# Patient Record
Sex: Male | Born: 1950 | Race: Black or African American | Hispanic: No | Smoking: Never smoker
Health system: Southern US, Community
[De-identification: ages and names within clinical notes are randomized; demographics above are authoritative.]

## PROBLEM LIST (undated history)

## (undated) DIAGNOSIS — K648 Other hemorrhoids: Secondary | ICD-10-CM

## (undated) DIAGNOSIS — H409 Unspecified glaucoma: Secondary | ICD-10-CM

## (undated) DIAGNOSIS — K219 Gastro-esophageal reflux disease without esophagitis: Secondary | ICD-10-CM

## (undated) DIAGNOSIS — R197 Diarrhea, unspecified: Secondary | ICD-10-CM

## (undated) DIAGNOSIS — R1310 Dysphagia, unspecified: Secondary | ICD-10-CM

## (undated) DIAGNOSIS — H269 Unspecified cataract: Secondary | ICD-10-CM

## (undated) DIAGNOSIS — G4730 Sleep apnea, unspecified: Secondary | ICD-10-CM

## (undated) HISTORY — DX: Sleep apnea, unspecified: G47.30

## (undated) HISTORY — DX: Gastro-esophageal reflux disease without esophagitis: K21.9

## (undated) HISTORY — DX: Unspecified glaucoma: H40.9

## (undated) HISTORY — PX: CATARACT EXTRACTION: SUR2

## (undated) HISTORY — PX: SKIN GRAFT: SHX250

## (undated) HISTORY — DX: Dysphagia, unspecified: R13.10

## (undated) HISTORY — DX: Diarrhea, unspecified: R19.7

## (undated) HISTORY — DX: Unspecified cataract: H26.9

## (undated) HISTORY — DX: Other hemorrhoids: K64.8

---

## 2008-05-01 HISTORY — PX: COLONOSCOPY: SHX174

## 2008-05-01 HISTORY — PX: ESOPHAGOGASTRODUODENOSCOPY: SHX1529

## 2016-05-01 HISTORY — PX: GLAUCOMA SURGERY: SHX656

## 2017-09-06 ENCOUNTER — Ambulatory Visit
Admission: RE | Admit: 2017-09-06 | Discharge: 2017-09-06 | Disposition: A | Discharge status: Home / Self Care | Payer: Medicare Other | Source: Ambulatory Visit | Attending: Family Medicine | Admitting: Family Medicine

## 2017-09-06 ENCOUNTER — Other Ambulatory Visit: Payer: Self-pay | Admitting: Family Medicine

## 2017-09-06 DIAGNOSIS — J342 Deviated nasal septum: Secondary | ICD-10-CM

## 2017-09-06 DIAGNOSIS — R05 Cough: Secondary | ICD-10-CM

## 2017-09-06 DIAGNOSIS — G4733 Obstructive sleep apnea (adult) (pediatric): Secondary | ICD-10-CM

## 2017-09-06 DIAGNOSIS — R059 Cough, unspecified: Secondary | ICD-10-CM

## 2017-09-06 DIAGNOSIS — J45909 Unspecified asthma, uncomplicated: Secondary | ICD-10-CM

## 2017-09-06 DIAGNOSIS — K219 Gastro-esophageal reflux disease without esophagitis: Secondary | ICD-10-CM

## 2017-09-06 DIAGNOSIS — Z634 Disappearance and death of family member: Secondary | ICD-10-CM

## 2017-09-06 DIAGNOSIS — F329 Major depressive disorder, single episode, unspecified: Secondary | ICD-10-CM

## 2017-10-09 ENCOUNTER — Encounter (HOSPITAL_COMMUNITY): Payer: Self-pay | Admitting: Emergency Medicine

## 2017-10-09 ENCOUNTER — Emergency Department (HOSPITAL_COMMUNITY)
Admission: EM | Admit: 2017-10-09 | Discharge: 2017-10-09 | Disposition: A | Discharge status: Home / Self Care | Payer: Medicare Other | Attending: Emergency Medicine | Admitting: Emergency Medicine

## 2017-10-09 ENCOUNTER — Other Ambulatory Visit: Payer: Self-pay

## 2017-10-09 DIAGNOSIS — K649 Unspecified hemorrhoids: Secondary | ICD-10-CM | POA: Insufficient documentation

## 2017-10-09 DIAGNOSIS — Z5321 Procedure and treatment not carried out due to patient leaving prior to being seen by health care provider: Secondary | ICD-10-CM | POA: Insufficient documentation

## 2017-10-09 NOTE — ED Notes (Signed)
Patient requested that we not re assess vital signs. Patient states he just wants to go back to a room.

## 2017-10-09 NOTE — ED Triage Notes (Signed)
Pt reports bleeding hemorrhoids today. Pt reports bleeding is more than normal.

## 2017-10-09 NOTE — ED Notes (Signed)
Pt states that he can not wait any longer. Apologized for delay.

## 2017-10-09 NOTE — ED Notes (Signed)
Pt refused vitals 

## 2018-12-03 ENCOUNTER — Other Ambulatory Visit: Payer: Self-pay

## 2018-12-03 DIAGNOSIS — Z20822 Contact with and (suspected) exposure to covid-19: Secondary | ICD-10-CM

## 2018-12-04 LAB — NOVEL CORONAVIRUS, NAA: SARS-CoV-2, NAA: NOT DETECTED

## 2019-01-17 ENCOUNTER — Other Ambulatory Visit: Payer: Self-pay | Admitting: *Deleted

## 2019-01-17 DIAGNOSIS — Z20822 Contact with and (suspected) exposure to covid-19: Secondary | ICD-10-CM

## 2019-01-18 LAB — NOVEL CORONAVIRUS, NAA: SARS-CoV-2, NAA: NOT DETECTED

## 2019-03-17 ENCOUNTER — Other Ambulatory Visit: Payer: Self-pay

## 2019-03-17 ENCOUNTER — Encounter: Payer: Self-pay | Admitting: Internal Medicine

## 2019-03-17 ENCOUNTER — Ambulatory Visit (INDEPENDENT_AMBULATORY_CARE_PROVIDER_SITE_OTHER): Payer: Medicare Other | Admitting: Internal Medicine

## 2019-03-17 VITALS — BP 110/76 | HR 61 | Ht 72.0 in | Wt 209.0 lb

## 2019-03-17 DIAGNOSIS — G4733 Obstructive sleep apnea (adult) (pediatric): Secondary | ICD-10-CM

## 2019-03-17 DIAGNOSIS — K219 Gastro-esophageal reflux disease without esophagitis: Secondary | ICD-10-CM | POA: Diagnosis not present

## 2019-03-17 NOTE — Assessment & Plan Note (Signed)
Has lost 30 lbs since original dx OSA and trial of CPAP 10 years ago, but told he snores loudly. Few teeth, so not candidate for oral appliance.  Plan- sleep study, then I agreed to refer first to ENT for eval of nasopharyngeal discomfort he relates to old nasal fax. He also asks about Inspire. Then, if he chooses CPAP trial again, we can help with that if appropriate.

## 2019-03-17 NOTE — Progress Notes (Signed)
03/17/2019- 68 yoM never smoker for sleep evaluation.  Medical problem list includes GERD, Insomnia. He had sleep studies in [place]/ [place] several years ago and was prescribed CPAP there. Tried different masks but couldn't get comfortable. At that time he was trying to care for terminally ill wife. After she passed, he moved here, has lost 30 lbs with diet and exercise. Current partner tells him he snores loudly.  He broke his nose several years ago, breathes poorly through nose lying down, and tends to wake in AM with sore R side of throat. Breathe Right strips no help.  Can't sleep on back due to low back pain. Occasional sinus headache. Not aware of heart or lung disease. Has had multiple dental extractions. He asks about Inspire surgery and ENT attention to nasopharynx. Epworth score 7  Had flu vax Body weight today 209 lbs  Prior to Admission medications   Medication Sig Start Date End Date Taking? Authorizing Provider  HYDROcodone-acetaminophen (NORCO) 10-325 MG tablet Take 1 tablet by mouth 3 (three) times daily as needed. 02/19/19  Yes [provider]  lidocaine (LIDODERM) 5 % APPLY 1 PATCH TO SKIN DAILY AS DIRECTED *LEAVE ON UP TO 12 HOURS, THEN REMOVE FOR 12 HOURS* 12/30/18  Yes [provider]  omeprazole (PRILOSEC) 40 MG capsule TAKE 1 CAPSULE BY MOUTH ONCE DAILY AS NEEDED 12/14/18  Yes [provider]  tadalafil (CIALIS) 5 MG tablet SMARTSIG:1 Tablet(s) By Mouth As Needed 02/18/19  Yes [provider]   History reviewed. No pertinent past medical history. Past Surgical History:  Procedure Laterality Date  . SKIN GRAFT     History reviewed. No pertinent family history. Social History   Socioeconomic History  . Marital status: Widowed    Spouse name: Not on file  . Number of children: Not on file  . Years of education: Not on file  . Highest education level: Not on file  Occupational History  . Not on file  Social Needs  .  Financial resource strain: Not on file  . Food insecurity    Worry: Not on file    Inability: Not on file  . Transportation needs    Medical: Not on file    Non-medical: Not on file  Tobacco Use  . Smoking status: Never Smoker  . Smokeless tobacco: Never Used  Substance and Sexual Activity  . Alcohol use: Never    Frequency: Never  . Drug use: Never  . Sexual activity: Not on file  Lifestyle  . Physical activity    Days per week: Not on file    Minutes per session: Not on file  . Stress: Not on file  Relationships  . Social Herbalist on phone: Not on file    Gets together: Not on file    Attends religious service: Not on file    Active member of club or organization: Not on file    Attends meetings of clubs or organizations: Not on file    Relationship status: Not on file  . Intimate partner violence    Fear of current or ex partner: Not on file    Emotionally abused: Not on file    Physically abused: Not on file    Forced sexual activity: Not on file  Other Topics Concern  . Not on file  Social History Narrative  . Not on file   ROS-see HPI   + = positive Constitutional:    +weight loss, night sweats,  fevers, chills, fatigue, lassitude. HEENT:    +headaches, difficulty swallowing, tooth/dental problems, sore throat,       sneezing, itching, ear ache, nasal congestion, post nasal drip, snoring CV:    chest pain, orthopnea, PND, swelling in lower extremities, anasarca,                                  dizziness, palpitations Resp:   shortness of breath with exertion or at rest.                productive cough,   non-productive cough, coughing up of blood.              change in color of mucus.  wheezing.   Skin:    rash or lesions. GI:  No-   heartburn, indigestion, abdominal pain, nausea, vomiting, diarrhea,                 change in bowel habits, loss of appetite GU: dysuria, change in color of urine, no urgency or frequency.   flank pain. MS:   joint  pain, stiffness, decreased range of motion, back pain. Neuro-     nothing unusual Psych:  change in mood or affect.  depression or anxiety.   memory loss.  OBJ- Physical Exam General- Alert, Oriented, Affect-appropriate, Distress- none acute Skin- rash-none, lesions- none, excoriation- none Lymphadenopathy- none Head- atraumatic            Eyes- Gross vision intact, PERRLA, conjunctivae and secretions clear            Ears- Hearing, canals-normal            Nose- Clear, No-mucus, polyps, erosion, perforation             Throat- Mallampati III-IV , mucosa clear , drainage- none, tonsils- atrophic Neck- flexible , trachea midline, no stridor , thyroid nl, carotid no bruit Chest - symmetrical excursion , unlabored           Heart/CV- RRR , no murmur , no gallop  , no rub, nl s1 s2                           - JVD- none , edema- none, stasis changes- none, varices- none           Lung- clear to P&A, wheeze- none, cough- none , dullness-none, rub- none           Chest wall-  Abd-  Br/ Gen/ Rectal- Not done, not indicated Extrem- cyanosis- none, clubbing, none, atrophy- none, strength- nl Neuro- grossly intact to observation

## 2019-03-17 NOTE — Patient Instructions (Addendum)
Order- schedule home sleep test    Dx OSA  Please call us about 2 weeks after your sleep test is done for results and recommendations. I am anticipating at that point we may want to refer you to ENT to evaluate your upper airway, and possibly to discuss the [facility] surgical procedure for sleep apnea.

## 2019-03-17 NOTE — Assessment & Plan Note (Signed)
Managed by PCP

## 2019-03-24 ENCOUNTER — Other Ambulatory Visit: Payer: Self-pay

## 2019-03-24 DIAGNOSIS — Z20822 Contact with and (suspected) exposure to covid-19: Secondary | ICD-10-CM

## 2019-03-25 LAB — NOVEL CORONAVIRUS, NAA: SARS-CoV-2, NAA: NOT DETECTED

## 2019-05-07 ENCOUNTER — Ambulatory Visit: Payer: Medicare Other | Attending: Internal Medicine

## 2019-05-07 DIAGNOSIS — Z20822 Contact with and (suspected) exposure to covid-19: Secondary | ICD-10-CM

## 2019-05-08 LAB — NOVEL CORONAVIRUS, NAA: SARS-CoV-2, NAA: NOT DETECTED

## 2019-05-14 ENCOUNTER — Telehealth: Payer: Self-pay | Admitting: Internal Medicine

## 2019-05-14 NOTE — Telephone Encounter (Signed)
Scheduled for 1/15 @ 9 AM.  Nothing further needed at this time

## 2019-05-16 ENCOUNTER — Ambulatory Visit: Payer: Medicare Other

## 2019-05-16 ENCOUNTER — Other Ambulatory Visit: Payer: Self-pay

## 2019-05-16 DIAGNOSIS — G4733 Obstructive sleep apnea (adult) (pediatric): Secondary | ICD-10-CM

## 2019-06-03 ENCOUNTER — Other Ambulatory Visit: Payer: Self-pay

## 2019-06-03 ENCOUNTER — Ambulatory Visit: Payer: Medicare Other

## 2019-06-03 DIAGNOSIS — G4733 Obstructive sleep apnea (adult) (pediatric): Secondary | ICD-10-CM | POA: Diagnosis not present

## 2019-06-06 DIAGNOSIS — G4733 Obstructive sleep apnea (adult) (pediatric): Secondary | ICD-10-CM

## 2019-06-10 ENCOUNTER — Encounter: Payer: Self-pay | Admitting: Gastroenterology

## 2019-06-13 ENCOUNTER — Telehealth: Payer: Self-pay | Admitting: Internal Medicine

## 2019-06-13 NOTE — Telephone Encounter (Signed)
Called the patient and made him aware we had not called. However, he may have received an automated message reminding him of his appointment with Dr. Annamaria Boots on 06/17/19.  Patient voiced understanding. Nothing further needed at this time.

## 2019-06-17 ENCOUNTER — Ambulatory Visit (INDEPENDENT_AMBULATORY_CARE_PROVIDER_SITE_OTHER): Payer: Medicare Other | Admitting: Internal Medicine

## 2019-06-17 ENCOUNTER — Other Ambulatory Visit: Payer: Self-pay

## 2019-06-17 ENCOUNTER — Encounter: Payer: Self-pay | Admitting: Internal Medicine

## 2019-06-17 VITALS — BP 118/68 | HR 80 | Temp 97.3°F | Ht 72.0 in | Wt 208.0 lb

## 2019-06-17 DIAGNOSIS — G4733 Obstructive sleep apnea (adult) (pediatric): Secondary | ICD-10-CM | POA: Diagnosis not present

## 2019-06-17 DIAGNOSIS — J3489 Other specified disorders of nose and nasal sinuses: Secondary | ICD-10-CM | POA: Diagnosis not present

## 2019-06-17 NOTE — Assessment & Plan Note (Signed)
Didn't tolerate CPAP airflow 10+ years ago because airflow was uncomfortable in his nose. He suggests we retry CPAP. We will also refer to ENT to see if nasal airway can be improved, and to discuss options.  Plan- CPAP auto 5-20. Refer to ENT

## 2019-06-17 NOTE — Progress Notes (Signed)
HPI M never smoker followed for OSA, complicated by GERD, Insomnia, hx nasal fx, low back pain HST 06/03/19- AHI 33.1/ hr, desaturation to 79%, Body weight 209 l  ------------------------------------------------------------------------------- 03/17/2019- 68 yoM never smoker for sleep evaluation.  Medical problem list includes GERD, Insomnia. He had sleep studies in [place] several years ago and was prescribed CPAP there. Tried different masks but couldn't get comfortable. At that time he was trying to care for terminally ill wife. After she passed, he moved here, has lost 30 lbs with diet and exercise. Current partner tells him he snores loudly.  He broke his nose several years ago, breathes poorly through nose lying down, and tends to wake in AM with sore R side of throat. Breathe Right strips no help.  Can't sleep on back due to low back pain. Occasional sinus headache. Not aware of heart or lung disease. Has had multiple dental extractions. He asks about Inspire surgery and ENT attention to nasopharynx. 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 Deviation with chronically blocked R nostril made airflow from CPAP uncomfortable 10 years ago. We reviewed options. He would like to restart CPAP with newer technology, while also referring to ENT to discuss options.  ROS-see HPI   + = positive Constitutional:    +weight loss, night sweats, fevers, chills, fatigue, lassitude. HEENT:    +headaches, difficulty swallowing, tooth/dental problems, sore throat,       sneezing, itching, ear ache, nasal congestion, post nasal drip, snoring CV:    chest pain, orthopnea, PND, swelling in lower extremities, anasarca,                                   dizziness, palpitations Resp:   shortness of breath with exertion or at rest.                productive cough,   non-productive cough, coughing up of blood.              change in color of mucus.  wheezing.   Skin:    rash or lesions. GI:  No-   heartburn, indigestion, abdominal pain, nausea, vomiting, diarrhea,                 change in bowel habits, loss of appetite GU: dysuria, change in color of urine, no urgency or frequency.   flank pain. MS:   joint pain, stiffness, decreased range of motion, back pain. Neuro-     nothing unusual Psych:  change in mood or affect.  depression or anxiety.   memory loss.  OBJ- Physical Exam General- Alert, Oriented, Affect-appropriate, Distress- none acute Skin- rash-none, lesions- none, excoriation- none Lymphadenopathy- none Head- atraumatic            Eyes- Gross vision intact, PERRLA, conjunctivae and secretions clear            Ears- Hearing, canals-normal            Nose- +Old trauma, narrow on R            Throat- Mallampati III-IV , mucosa clear , drainage- none, tonsils-  atrophic Neck- flexible , trachea midline, no stridor , thyroid nl, carotid no bruit Chest - symmetrical excursion , unlabored           Heart/CV- RRR , no murmur , no gallop  , no rub, nl s1 s2                           - JVD- none , edema- none, stasis changes- none, varices- none           Lung- clear to P&A, wheeze- none, cough- none , dullness-none, rub- none           Chest wall-  Abd-  Br/ Gen/ Rectal- Not done, not indicated Extrem- cyanosis- none, clubbing, none, atrophy- none, strength- nl Neuro- grossly intact to observation

## 2019-06-17 NOTE — Patient Instructions (Signed)
Order- new DME, new CPAP auto 5-20, mask of choice, humidifier, supplies, AirView/ card   Order- Refer to [facility] ENT- Dr Redmond Baseman' group    Dx Nasal obstruction, OSA  Please call if we can help

## 2019-06-24 ENCOUNTER — Telehealth: Payer: Self-pay | Admitting: Internal Medicine

## 2019-06-24 NOTE — Telephone Encounter (Signed)
Called that patient back and advised him the DME was [facility]. Advised if he had not heard anything from them by 06/27/19 to call them at [phone] the following Monday to get a status on his order.  The patient stated he has already been contacted by ENT and has an appointment.  Nothing further needed at this time.

## 2019-06-27 ENCOUNTER — Encounter: Payer: Medicare Other | Admitting: Gastroenterology

## 2019-07-14 ENCOUNTER — Encounter: Payer: Self-pay | Admitting: Internal Medicine

## 2019-07-15 ENCOUNTER — Telehealth: Payer: Self-pay | Admitting: Internal Medicine

## 2019-07-15 DIAGNOSIS — G4733 Obstructive sleep apnea (adult) (pediatric): Secondary | ICD-10-CM

## 2019-07-15 NOTE — Telephone Encounter (Signed)
Called and spoke with pt who stated he feels like the pressure has been too much for him. Pt stated last night he was wearing the cpap but took awhile for him to go to sleep. Pt woke up with dry mouth, drank some water which helped some with that. Pt stated due to the pressure he ended up having intense pain in rib around sternum which he believes is from the pressure.  Pt wants to know if the pressure could be adjusted or if he should fully stop wearing the cpap. Dr. Annamaria Boots, please advise on this for pt. Download has been printed for review.

## 2019-07-15 NOTE — Telephone Encounter (Signed)
Order- DME [facility]- please change autopap range to 5-15  Please remind patient to continue using Flonase in nose every night as instructed by Dr Redmond Baseman ENT, to try to improve his nasal airway.

## 2019-07-15 NOTE — Telephone Encounter (Signed)
Called and spoke with Ronald Nash letting him know the info stated by Dr. Annamaria Boots and he verbalized understanding. Order has been placed for Ronald Nash's settings to be changed. Nothing further needed.

## 2019-08-11 ENCOUNTER — Encounter: Payer: Self-pay | Admitting: Internal Medicine

## 2019-08-13 ENCOUNTER — Telehealth: Payer: Self-pay

## 2019-08-13 NOTE — Telephone Encounter (Signed)
Tried to contact patient regarding a fax we got from [place] to see if we are able to get patient into the office at next available per Dr.Young patient can make a office  Visit with 1 of the NP'S.

## 2019-08-18 NOTE — Progress Notes (Signed)
@Patient  ID: Ronald Nash, male    DOB: 07-15-1950, 69 y.o.   MRN: [id number]  Chief Complaint  Patient presents with  . Follow-up    F/U for OSA on cpap. Increased throat dryness and neck muscle spasms since using cpap machine. Has stopped using cpap machine due to this.     Referring provider: Antonietta Jewel, MD  HPI:  69 year old male never smoker followed in our office for severe obstructive sleep apnea  PMH: GERD, insomnia Smoker/ Smoking History: Never smoker Maintenance:  None  Pt of: Dr. Annamaria Boots  08/19/2019  - Visit   69 year old male never smoker followed in our office for severe obstructive sleep apnea.  Patient was started on CPAP therapy.  He reports that he had noticed increased throat dryness and muscle spasms so he has briefly stopped using his CPAP machine.  He would like to discuss this today.  Patient's AHI was 33.1 in February/2021.  Patient CPAP compliance shows adequate compliance.  See that information listed below:  07/13/2019-08/11/2019-28 in the last 30 days used, 16 of those days greater than 4 hours, average usage 4 hours and 46 minutes, APAP setting 5-15, 95th percentile 13.1, AHI 7 >>> Leaks on compliance report >>> Patient has had 2 mask changes already  Patient reports that he has been working with the [place] [facility] on his mask fit.  He also has been previously evaluated by ear nose and throat Dr. Redmond Baseman regarding consideration for an inspire device.  Patient reports he had previously been managed on CPAP over 10 years ago he cannot remember why he stopped using this.  Patient continues to work with the [facility].  He reports that he has been having "neck spasms" when he is using the CPAP.  He has tried two fullface mask.  He continues to have leaks on CPAP compliance report.  He is interested in a nasal mask.  He is using humidified air.  Questionaires / Pulmonary Flowsheets:   Epworth:  Results of the Epworth flowsheet 03/17/2019  Sitting and reading  0  Watching TV 1  Sitting, inactive in a public place (e.g. a theatre or a meeting) 0  As a passenger in a car for an hour without a break 0  Lying down to rest in the afternoon when circumstances permit 3  Sitting and talking to someone 1  Sitting quietly after a lunch without alcohol 2  In a car, while stopped for a few minutes in traffic 0  Total score 7    Tests:   HST 06/03/19- AHI 33.1/ hr, desaturation to 79%, Body weight 209 l   FENO:  No results found for: NITRICOXIDE  PFT: No flowsheet data found.  WALK:  No flowsheet data found.  Imaging: No results found.  Lab Results:  CBC No results found for: WBC, RBC, HGB, HCT, PLT, MCV, MCH, MCHC, RDW, LYMPHSABS, MONOABS, EOSABS, BASOSABS  BMET No results found for: NA, K, CL, CO2, GLUCOSE, BUN, CREATININE, CALCIUM, GFRNONAA, GFRAA  BNP No results found for: BNP  ProBNP No results found for: PROBNP  Specialty Problems      Pulmonary Problems   Obstructive sleep apnea    HST 06/03/19- AHI 33.1/ hr, desaturation to 79%, Body weight 209 lbs         No Known Allergies  Immunization History  Administered Date(s) Administered  . Influenza, High Dose Seasonal PF 02/28/2017  . Moderna SARS-COVID-2 Vaccination 06/08/2019  . Pneumococcal Conjugate-13 02/28/2017    History reviewed. No  pertinent past medical history.  Tobacco History: Social History   Tobacco Use  Smoking Status Never Smoker  Smokeless Tobacco Never Used   Counseling given: Not Answered   Continue to not smoke  Outpatient Encounter Medications as of 08/19/2019  Medication Sig  . HYDROcodone-acetaminophen (NORCO) 10-325 MG tablet Take 1 tablet by mouth 3 (three) times daily as needed.  . lidocaine (LIDODERM) 5 % APPLY 1 PATCH TO SKIN DAILY AS DIRECTED *LEAVE ON UP TO 12 HOURS, THEN REMOVE FOR 12 HOURS*  . omeprazole (PRILOSEC) 40 MG capsule TAKE 1 CAPSULE BY MOUTH ONCE DAILY AS NEEDED  . tadalafil (CIALIS) 5 MG tablet SMARTSIG:1  Tablet(s) By Mouth As Needed   No facility-administered encounter medications on file as of 08/19/2019.     Review of Systems  Review of Systems  Constitutional: Positive for fatigue. Negative for activity change, chills, fever and unexpected weight change.  HENT: Negative for postnasal drip, rhinorrhea, sinus pressure, sinus pain and sore throat.   Eyes: Negative.   Respiratory: Negative for cough, shortness of breath and wheezing.   Cardiovascular: Negative for chest pain and palpitations.  Gastrointestinal: Negative for constipation, diarrhea, nausea and vomiting.  Endocrine: Negative.   Genitourinary: Negative.   Musculoskeletal: Negative.   Skin: Negative.   Neurological: Negative for dizziness and headaches.  Psychiatric/Behavioral: Positive for sleep disturbance. Negative for dysphoric mood. The patient is not nervous/anxious.   All other systems reviewed and are negative.    Physical Exam  BP 122/70 (BP Location: Left Arm, Patient Position: Sitting, Cuff Size: Normal)   Pulse 66   Temp 97.6 F (36.4 C) (Temporal)   Ht 6' (1.829 m)   Wt 215 lb 9.6 oz (97.8 kg)   SpO2 98% Comment: on RA  BMI 29.24 kg/m   Wt Readings from Last 5 Encounters:  08/19/19 215 lb 9.6 oz (97.8 kg)  06/17/19 208 lb (94.3 kg)  03/17/19 209 lb (94.8 kg)  10/09/17 215 lb (97.5 kg)    BMI Readings from Last 5 Encounters:  08/19/19 29.24 kg/m  06/17/19 28.21 kg/m  03/17/19 28.35 kg/m  10/09/17 29.16 kg/m     Physical Exam Vitals and nursing note reviewed.  Constitutional:      General: He is not in acute distress.    Appearance: Normal appearance. He is normal weight.  HENT:     Head: Normocephalic and atraumatic.     Right Ear: Hearing and external ear normal.     Left Ear: Hearing and external ear normal.     Nose: Nose normal. No mucosal edema or rhinorrhea.     Right Turbinates: Not enlarged.     Left Turbinates: Not enlarged.     Mouth/Throat:     Mouth: Mucous  membranes are dry.     Pharynx: Oropharynx is clear. No oropharyngeal exudate.     Comments: mallampati II Eyes:     Pupils: Pupils are equal, round, and reactive to light.  Cardiovascular:     Rate and Rhythm: Normal rate and regular rhythm.     Pulses: Normal pulses.     Heart sounds: Normal heart sounds. No murmur.  Pulmonary:     Effort: Pulmonary effort is normal.     Breath sounds: Normal breath sounds. No decreased breath sounds, wheezing or rales.  Musculoskeletal:     Cervical back: Normal range of motion.     Right lower leg: No edema.     Left lower leg: No edema.  Lymphadenopathy:  Cervical: No cervical adenopathy.  Skin:    General: Skin is warm and dry.     Capillary Refill: Capillary refill takes less than 2 seconds.     Findings: No erythema or rash.  Neurological:     General: No focal deficit present.     Mental Status: He is alert and oriented to person, place, and time.     Motor: No weakness.     Coordination: Coordination normal.     Gait: Gait is intact. Gait normal.  Psychiatric:        Mood and Affect: Mood normal.        Behavior: Behavior normal. Behavior is cooperative.        Thought Content: Thought content normal.        Judgment: Judgment normal.       Assessment & Plan:   Obstructive sleep apnea Severe obstructive sleep apnea in February/2021 Home sleep study No serum CO2 level on file Patient still reporting fatigue Struggling with mask fit Unsure if pressure settings are correct Patient interested in inspire device, has already been evaluated by Dr. Redmond Baseman Adequate CPAP compliance, leaks present, suboptimal AHI control  Plan: CPAP titration today BMP today 6-week follow-up with Dr. Annamaria Boots Order to DME company for mask of choice, patient is interested in nasal mask Can consider inspire device in the future if patient continues to be intolerant of CPAP    Return in about 6 weeks (around 09/30/2019), or if symptoms worsen or  fail to improve, for Follow up with Dr. Annamaria Boots.   Lauraine Rinne, NP 08/19/2019   This appointment required 34 minutes of patient care (this includes precharting, chart review, review of results, face-to-face care, etc.).

## 2019-08-19 ENCOUNTER — Ambulatory Visit (INDEPENDENT_AMBULATORY_CARE_PROVIDER_SITE_OTHER): Payer: Medicare Other | Admitting: Pulmonary Disease

## 2019-08-19 ENCOUNTER — Encounter: Payer: Self-pay | Admitting: Pulmonary Disease

## 2019-08-19 ENCOUNTER — Other Ambulatory Visit: Payer: Self-pay

## 2019-08-19 VITALS — BP 122/70 | HR 66 | Temp 97.6°F | Ht 72.0 in | Wt 215.6 lb

## 2019-08-19 DIAGNOSIS — G4733 Obstructive sleep apnea (adult) (pediatric): Secondary | ICD-10-CM

## 2019-08-19 LAB — BASIC METABOLIC PANEL
BUN: 13 mg/dL (ref 6–23)
CO2: 28 mEq/L (ref 19–32)
Calcium: 9 mg/dL (ref 8.4–10.5)
Chloride: 105 mEq/L (ref 96–112)
Creatinine, Ser: 1.2 mg/dL (ref 0.40–1.50)
GFR: 72.9 mL/min (ref >60.00)
Glucose, Bld: 82 mg/dL (ref 70–99)
Potassium: 3.8 mEq/L (ref 3.5–5.1)
Sodium: 139 mEq/L (ref 135–145)

## 2019-08-19 NOTE — Progress Notes (Signed)
Patient identification verified. Results of recent BMP reviewed. Per Wyn Quaker FNP, lab work is stable, proceed forward with CPAP titration as planned. Please keep follow up appointment with our office. Patient verbalized understanding of results.

## 2019-08-19 NOTE — Addendum Note (Signed)
Addended by: Suzzanne Cloud E on: 08/19/2019 09:57 AM   Modules accepted: Orders

## 2019-08-19 NOTE — Assessment & Plan Note (Signed)
Severe obstructive sleep apnea in February/2021 Home sleep study No serum CO2 level on file Patient still reporting fatigue Struggling with mask fit Unsure if pressure settings are correct Patient interested in inspire device, has already been evaluated by Dr. Redmond Baseman Adequate CPAP compliance, leaks present, suboptimal AHI control  Plan: CPAP titration today BMP today 6-week follow-up with Dr. Annamaria Boots Order to [place] for mask of choice, patient is interested in nasal mask Can consider inspire device in the future if patient continues to be intolerant of CPAP

## 2019-08-19 NOTE — Patient Instructions (Addendum)
You were seen today by Lauraine Rinne, NP  for:   1. OSA (obstructive sleep apnea)  - Cpap titration; Future - Basic metabolic panel; Future  Order to DME company for mask of choice, patient is interested in nasal mask  We recommend that you resume using your CPAP daily >>>Keep up the hard work using your device >>> Goal should be wearing this for the entire night that you are sleeping, at least 4 to 6 hours  Remember:  . Do not drive or operate heavy machinery if tired or drowsy.  . Please notify the supply company and office if you are unable to use your device regularly due to missing supplies or machine being broken.  . Work on maintaining a healthy weight and following your recommended nutrition plan  . Maintain proper daily exercise and movement  . Maintaining proper use of your device can also help improve management of other chronic illnesses such as: Blood pressure, blood sugars, and weight management.   BiPAP/ CPAP Cleaning:  >>>Clean weekly, with Dawn soap, and bottle brush.  Set up to air dry. >>> Wipe mask out daily with wet wipe or towelette  Could consider referral back to Dr. Redmond Baseman if you continue to be intolerant of using your CPAP  We recommend today:  Orders Placed This Encounter  Procedures  . Basic metabolic panel    Standing Status:   Future    Standing Expiration Date:   08/18/2020  . Cpap titration    Standing Status:   Future    Standing Expiration Date:   08/18/2020    Order Specific Question:   Where should this test be performed:    Answer:   [place]   Orders Placed This Encounter  Procedures  . Basic metabolic panel  . Cpap titration   No orders of the defined types were placed in this encounter.   Follow Up:    Return in about 6 weeks (around 09/30/2019), or if symptoms worsen or fail to improve, for Follow up with Dr. Annamaria Boots.   Please do your part to reduce the spread of COVID-19:      Reduce your risk of any  infection  and COVID19 by using the similar precautions used for avoiding the common cold or flu:  Marland Kitchen Wash your hands often with soap and warm water for at least 20 seconds.  If soap and water are not readily available, use an alcohol-based hand sanitizer with at least 60% alcohol.  . If coughing or sneezing, cover your mouth and nose by coughing or sneezing into the elbow areas of your shirt or coat, into a tissue or into your sleeve (not your hands). Langley Gauss A MASK when in public  . Avoid shaking hands with others and consider head nods or verbal greetings only. . Avoid touching your eyes, nose, or mouth with unwashed hands.  . Avoid close contact with people who are sick. . Avoid places or events with large numbers of people in one location, like concerts or sporting events. . If you have some symptoms but not all symptoms, continue to monitor at home and seek medical attention if your symptoms worsen. . If you are having a medical emergency, call 911.   [place] / e-Visit: [email or web address]         [facility]: [facility]: [phone]  [facility]: [phone]     It is flu season:   >>> Best ways to protect herself from the flu: Receive the yearly flu vaccine, practice good hand hygiene washing with soap and also using hand sanitizer when available, eat a nutritious meals, get adequate rest, hydrate appropriately   Please contact the office if your symptoms worsen or you have concerns that you are not improving.   Thank you for choosing [facility] for your healthcare, and for allowing [place] to partner with you on your healthcare journey. I am thankful to be able to provide care to you today.   Wyn Quaker FNP-C

## 2019-09-02 ENCOUNTER — Other Ambulatory Visit (HOSPITAL_COMMUNITY): Payer: Medicare Other

## 2019-09-04 ENCOUNTER — Encounter (HOSPITAL_BASED_OUTPATIENT_CLINIC_OR_DEPARTMENT_OTHER): Payer: Medicare Other | Admitting: Pulmonary Disease

## 2019-09-16 ENCOUNTER — Ambulatory Visit: Payer: Medicare Other | Admitting: Internal Medicine

## 2019-10-02 ENCOUNTER — Encounter: Payer: Self-pay | Admitting: Internal Medicine

## 2019-10-06 ENCOUNTER — Other Ambulatory Visit: Payer: Self-pay

## 2019-10-06 ENCOUNTER — Ambulatory Visit (INDEPENDENT_AMBULATORY_CARE_PROVIDER_SITE_OTHER): Payer: Medicare Other | Admitting: Internal Medicine

## 2019-10-06 ENCOUNTER — Encounter: Payer: Self-pay | Admitting: Internal Medicine

## 2019-10-06 VITALS — BP 118/70 | HR 71 | Temp 97.6°F | Ht 73.0 in | Wt 223.6 lb

## 2019-10-06 DIAGNOSIS — G4733 Obstructive sleep apnea (adult) (pediatric): Secondary | ICD-10-CM | POA: Diagnosis not present

## 2019-10-06 NOTE — Patient Instructions (Addendum)
Order- schedule CPAP mask fitting at sleep center  Order- Patient requests DME change from [place] due to service complaints Continue auto 5-15, mask of choice, humidifier, supplies, AirView/ card  Please call if we can help

## 2019-10-06 NOTE — Progress Notes (Signed)
HPI M never smoker followed for OSA, complicated by GERD, Insomnia, hx nasal fx, low back pain HST 06/03/19- AHI 33.1/ hr, desaturation to 79%, Body weight 209 l  -------------------------------------------------------------------------------   06/17/19- 68 yoM never smoker followed for OSA, complicated by GERD, Insomnia, hx nasal fx, low back pain HST 06/03/19- AHI 33.1/ hr, desaturation to 79%, Body weight 209 lbs -----f/u OSA .Sleep study done 06/03/2019 Body weight today 208 lbs Old nasal fx never repaired. Deviation with chronically blocked R nostril made airflow from CPAP uncomfortable 10 years ago. We reviewed options. He would like to restart CPAP with newer technology, while also referring to ENT to discuss options.  67/21- 68 yoM never smoker followed for OSA, complicated by GERD, Insomnia, hx nasal fx, low back pain CPAP auto 5-15/ [employer] Download compliance 83%, AHI 11.4/ hr- mostly obstructive events He has met Dr Redmond Baseman to discuss ENT alternatives. Didn't come away with a plan of action.  Bothersome nasal stuffiness not helped by nasal strips. Body weight today 223 lbs  ROS-see HPI   + = positive Constitutional:    +weight loss, night sweats, fevers, chills, fatigue, lassitude. HEENT:    +headaches, difficulty swallowing, tooth/dental problems, sore throat,       sneezing, itching, ear ache,+ nasal congestion, post nasal drip, snoring CV:    chest pain, orthopnea, PND, swelling in lower extremities, anasarca,                                   dizziness, palpitations Resp:   shortness of breath with exertion or at rest.                productive cough,   non-productive cough, coughing up of blood.              change in color of mucus.  wheezing.   Skin:    rash or lesions. GI:  No-   heartburn, indigestion, abdominal pain, nausea, vomiting, diarrhea,                 change in bowel habits, loss of appetite GU: dysuria, change in color of urine, no urgency or frequency.   flank  pain. MS:   joint pain, stiffness, decreased range of motion, back pain. Neuro-     nothing unusual Psych:  change in mood or affect.  depression or anxiety.   memory loss.  OBJ- Physical Exam General- Alert, Oriented, Affect-appropriate, Distress- none acute Skin- rash-none, lesions- none, excoriation- none Lymphadenopathy- none Head- atraumatic            Eyes- Gross vision intact, PERRLA, conjunctivae and secretions clear            Ears- Hearing, canals-normal            Nose- +Old trauma, narrow on R            Throat- Mallampati III-IV , mucosa clear , drainage- none, tonsils- atrophic Neck- flexible , trachea midline, no stridor , thyroid nl, carotid no bruit Chest - symmetrical excursion , unlabored           Heart/CV- RRR , no murmur , no gallop  , no rub, nl s1 s2                           - JVD- none , edema- none, stasis changes- none, varices- none  Lung- clear to P&A, wheeze- none, cough- none , dullness-none, rub- none           Chest wall-  Abd-  Br/ Gen/ Rectal- Not done, not indicated Extrem- cyanosis- none, clubbing, none, atrophy- none, strength- nl Neuro- grossly intact to observation

## 2019-10-25 ENCOUNTER — Other Ambulatory Visit (HOSPITAL_COMMUNITY)
Admission: RE | Admit: 2019-10-25 | Discharge: 2019-10-25 | Disposition: A | Discharge status: Home / Self Care | Payer: Medicare Other | Source: Ambulatory Visit | Attending: Internal Medicine | Admitting: Internal Medicine

## 2019-10-25 DIAGNOSIS — Z01812 Encounter for preprocedural laboratory examination: Secondary | ICD-10-CM | POA: Insufficient documentation

## 2019-10-25 DIAGNOSIS — Z20822 Contact with and (suspected) exposure to covid-19: Secondary | ICD-10-CM | POA: Diagnosis not present

## 2019-10-25 LAB — SARS CORONAVIRUS 2 (TAT 6-24 HRS): SARS Coronavirus 2: NEGATIVE

## 2019-10-28 ENCOUNTER — Other Ambulatory Visit (HOSPITAL_BASED_OUTPATIENT_CLINIC_OR_DEPARTMENT_OTHER): Payer: Medicare Other | Admitting: Pulmonary Disease

## 2019-11-17 ENCOUNTER — Ambulatory Visit (HOSPITAL_BASED_OUTPATIENT_CLINIC_OR_DEPARTMENT_OTHER): Payer: Medicare Other | Attending: Internal Medicine | Admitting: Pulmonary Disease

## 2019-11-17 DIAGNOSIS — G4733 Obstructive sleep apnea (adult) (pediatric): Secondary | ICD-10-CM

## 2019-11-18 ENCOUNTER — Other Ambulatory Visit: Payer: Self-pay

## 2019-12-21 NOTE — Assessment & Plan Note (Signed)
Benefits from CPAP but residual events are higher than we would like.  Plan- discussed options. Refer for mask fitting. Patient requests change DME from [place].

## 2020-02-02 ENCOUNTER — Ambulatory Visit: Payer: Medicare Other | Admitting: Internal Medicine

## 2020-03-06 IMAGING — CR DG CHEST 2V
3 series · 3 of 3 positions shown · non-contrast
Comparison: None.

CLINICAL DATA: 66-year-old male with a history of productive cough
for 2 months

EXAM:
CHEST - 2 VIEW

[w chest pa]
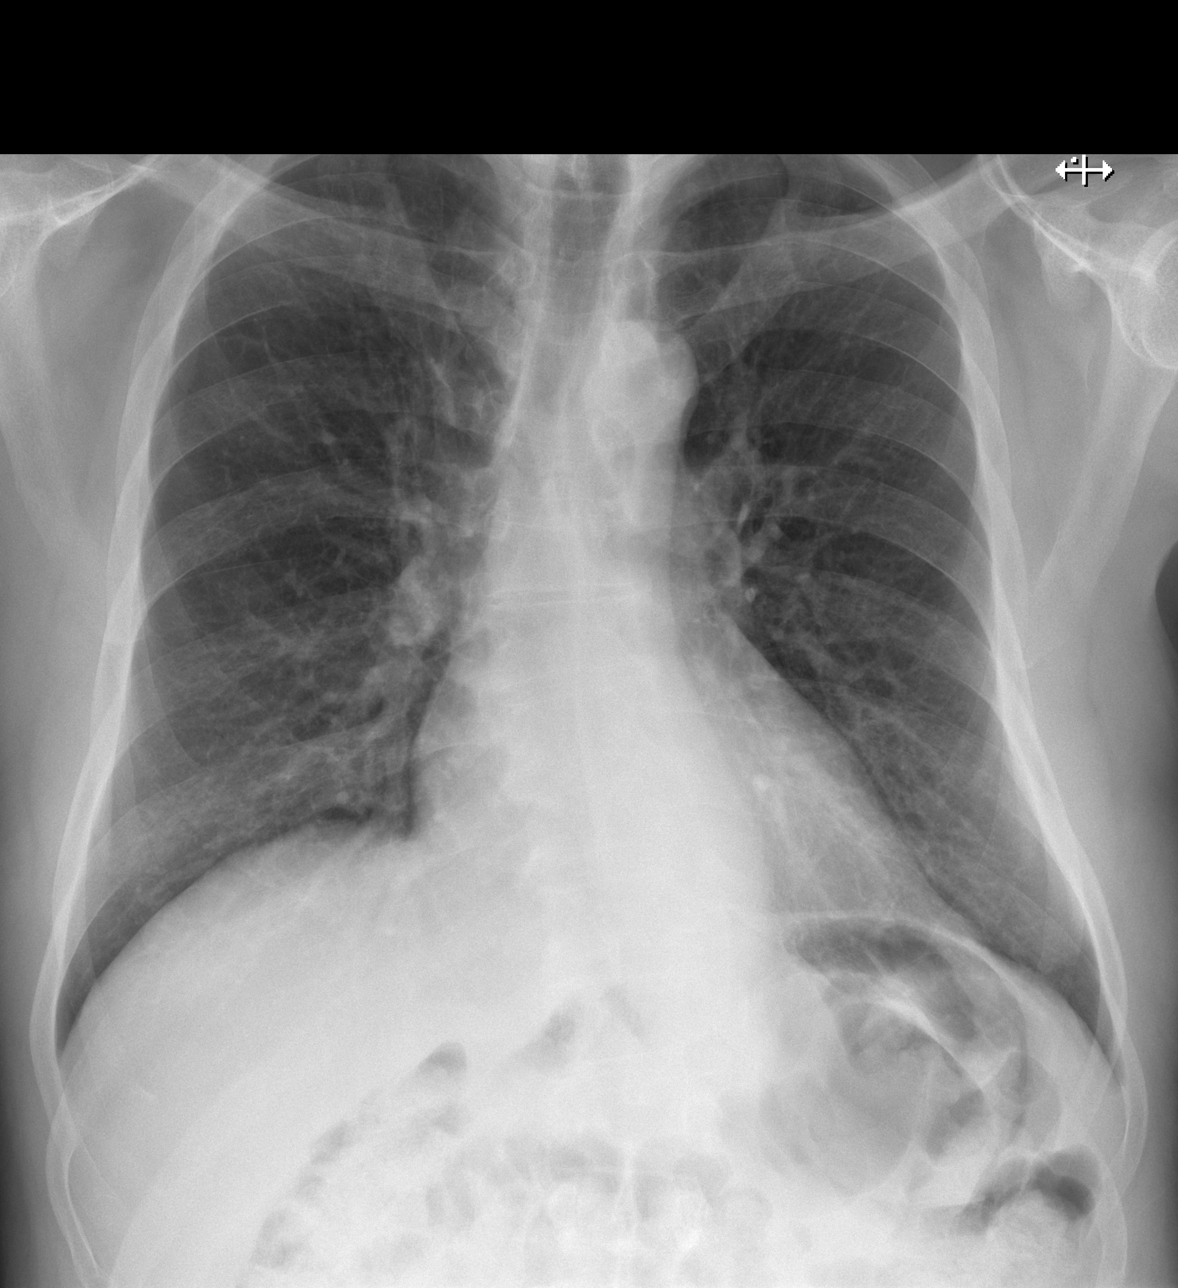

[w chest lat (1 of 2)]
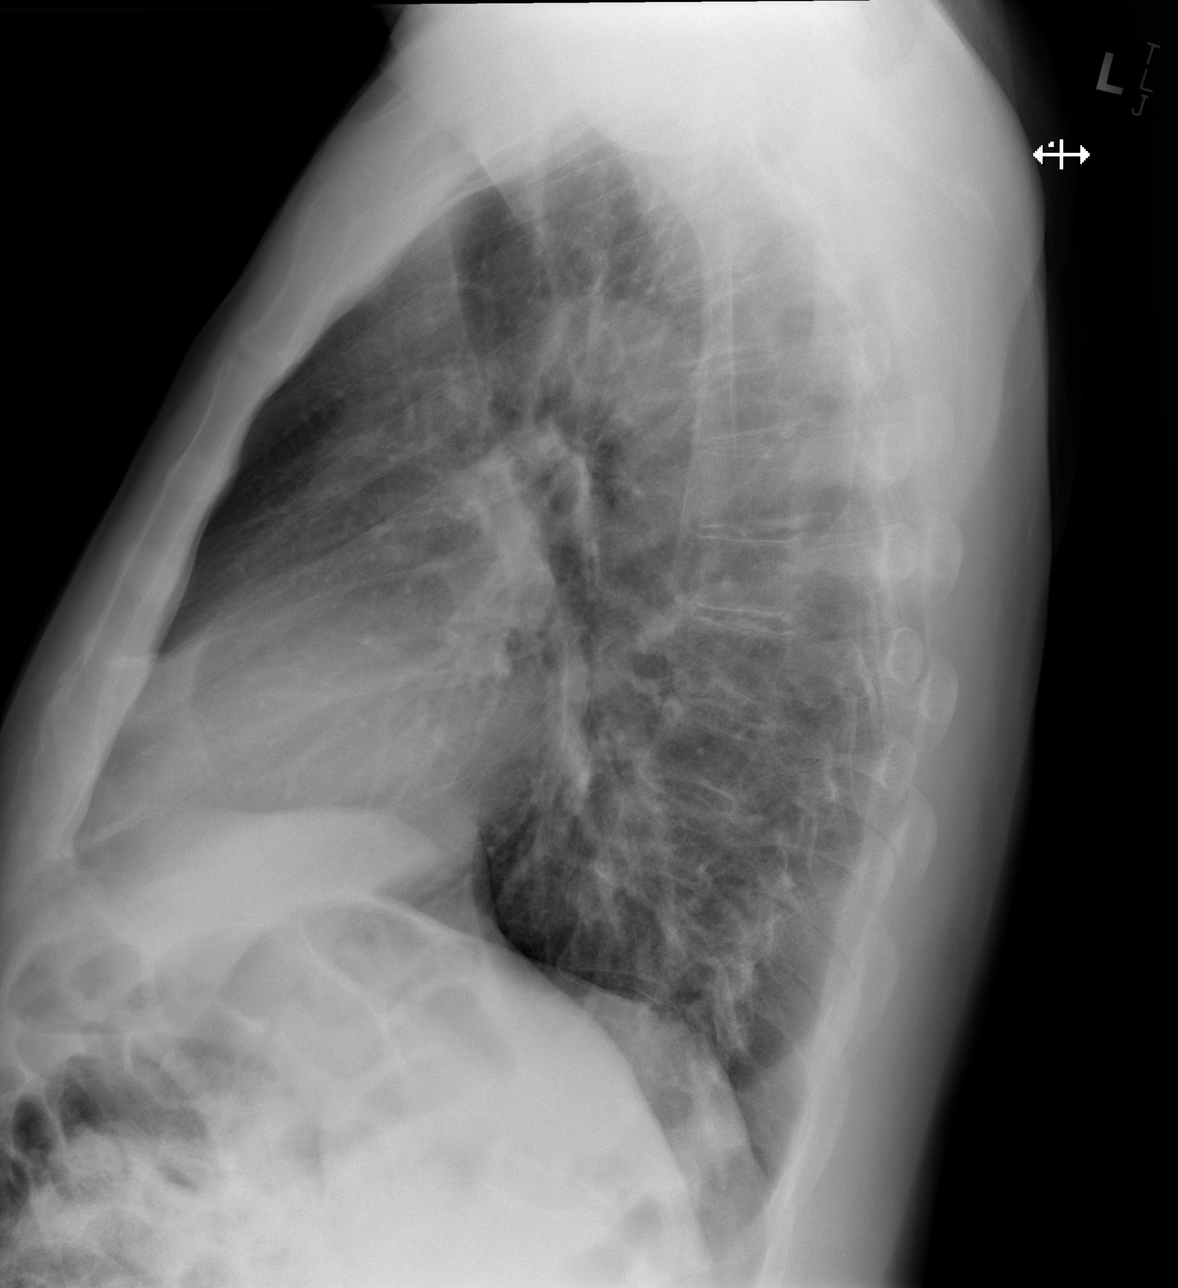

[w chest lat (2 of 2)]
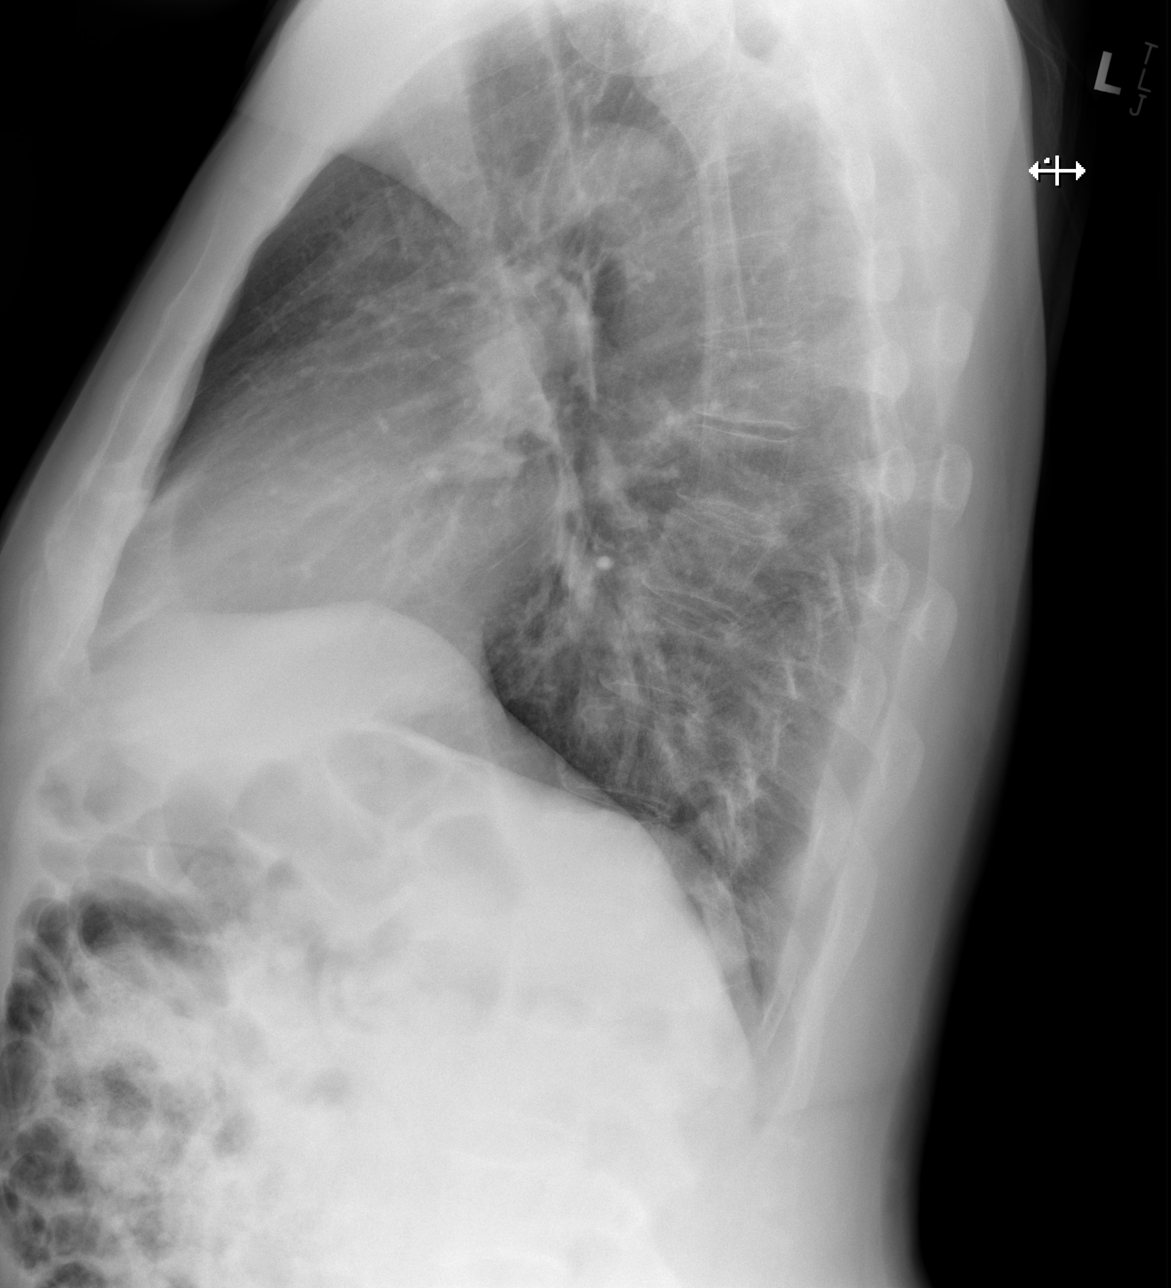

[3 of 3 positions shown; findings below may reference images not displayed]

FINDINGS: Cardiomediastinal silhouette within normal limits. No evidence of
central vascular congestion. No interlobular septal thickening. No
pneumothorax or pleural effusion. No confluent airspace disease.
Coarsened interstitial markings with no comparison.

No acute displaced fracture. S-shaped scoliotic curvature of the
thoracolumbar spine.
IMPRESSION: Chronic lung changes without evidence of superimposed acute
cardiopulmonary disease.

## 2020-07-06 ENCOUNTER — Encounter: Payer: Self-pay | Admitting: Gastroenterology

## 2020-08-11 ENCOUNTER — Ambulatory Visit (INDEPENDENT_AMBULATORY_CARE_PROVIDER_SITE_OTHER): Payer: Medicare Other | Admitting: Gastroenterology

## 2020-08-11 ENCOUNTER — Other Ambulatory Visit: Payer: Self-pay

## 2020-08-11 ENCOUNTER — Encounter: Payer: Self-pay | Admitting: Gastroenterology

## 2020-08-11 DIAGNOSIS — R1319 Other dysphagia: Secondary | ICD-10-CM

## 2020-08-11 DIAGNOSIS — Z1211 Encounter for screening for malignant neoplasm of colon: Secondary | ICD-10-CM | POA: Diagnosis not present

## 2020-08-11 DIAGNOSIS — K219 Gastro-esophageal reflux disease without esophagitis: Secondary | ICD-10-CM

## 2020-08-11 HISTORY — PX: SKIN GRAFT: SHX250

## 2020-08-11 MED ORDER — OMEPRAZOLE 40 MG PO CPDR
40.0000 mg | DELAYED_RELEASE_CAPSULE | Freq: Every day | ORAL | 3 refills | Status: DC
Start: 2020-08-11 — End: 2020-08-12

## 2020-08-11 NOTE — Progress Notes (Signed)
Chief Complaint:   Referring Provider:  Antonietta Jewel, MD      ASSESSMENT AND PLAN;   #1. GERD with eso dysphagia. D/d includes eso stricture, Schatzki's ring, motility disorder, EoE, pill induced esophagitis, r/o esophageal carcinoma/extrinsic lesions or Achalasia.  #2.  Colorectal cancer screening  Plan: -Continue omeprazole 40 mg p.o. QD, 1/2 hr before breakfast. #90.  11 refills. -Ba swallow with tab -EGD with eso bx/dil and colon. -I have instructed patient to chew foods especially meats and breads well and eat slowly.   Discussed risks & benefits. Risks including rare perforation req laparotomy, bleeding after bx/polypectomy req blood transfusion, rarely missing neoplasms, risks of anesthesia/sedation. Benefits outweigh the risks. Patient agrees to proceed. All the questions were answered. Consent forms given for review.   HPI:    Ronald Nash is a 70 y.o. male  C/O solid food dysphagia x 1year, getting somewhat worse, mostly with breads and meats.  Mid chest.  Has occasionally taken liquids to "wash it off" no weight loss.  No melena or hematochezia.  No significant diarrhea or constipation.  No abdominal pain.  Rare rectal bleeding attributed to Hoids  Nl CBC, CMP-labs reviewed.  EGD/colonoscopy more than 10 years ago [place].  He does not remember the name of the physician.  Past Medical History:  Diagnosis Date  . Acid reflux   . Diarrhea   . Glaucoma   . Internal hemorrhoid   . Sleep apnea   . Trouble swallowing     Past Surgical History:  Procedure Laterality Date  . COLONOSCOPY  2010  . ESOPHAGOGASTRODUODENOSCOPY  2010  . GLAUCOMA SURGERY  2018  . SKIN GRAFT  08/11/2020   patient denies this     Family History  Problem Relation Age of Onset  . Hodgkin's lymphoma Mother     Social History   Tobacco Use  . Smoking status: Never Smoker  . Smokeless tobacco: Never Used  Substance Use Topics  . Alcohol use: Never  . Drug use: Never     Current Outpatient Medications  Medication Sig Dispense Refill  . fluticasone (FLONASE) 50 MCG/ACT nasal spray 2 sprays by Each Nare route daily.    Marland Kitchen HYDROcodone-acetaminophen (NORCO) 10-325 MG tablet Take 1 tablet by mouth 3 (three) times daily as needed.    . lidocaine (LIDODERM) 5 % APPLY 1 PATCH TO SKIN DAILY AS DIRECTED *LEAVE ON UP TO 12 HOURS, THEN REMOVE FOR 12 HOURS*    . omeprazole (PRILOSEC) 40 MG capsule TAKE 1 CAPSULE BY MOUTH ONCE DAILY AS NEEDED    . tadalafil (CIALIS) 5 MG tablet SMARTSIG:1 Tablet(s) By Mouth As Needed     No current facility-administered medications for this visit.    No Known Allergies  Review of Systems:  Constitutional: Denies fever, chills, diaphoresis, appetite change and fatigue.  HEENT: Denies photophobia, eye pain, redness, hearing loss, ear pain, congestion, sore throat, rhinorrhea, sneezing, mouth sores, neck pain, neck stiffness and tinnitus.   Respiratory: Denies SOB, DOE, cough, chest tightness,  and wheezing.   Cardiovascular: Denies chest pain, palpitations and leg swelling.  Genitourinary: Denies dysuria, urgency, frequency, hematuria, flank pain and difficulty urinating.  Musculoskeletal: Denies myalgias, back pain, joint swelling, arthralgias and gait problem.  Skin: No rash.  Neurological: Denies dizziness, seizures, syncope, weakness, light-headedness, numbness and headaches.  Hematological: Denies adenopathy. Easy bruising, personal or family bleeding history  Psychiatric/Behavioral: No anxiety or depression     Physical Exam:    There were  no vitals taken for this visit. Wt Readings from Last 3 Encounters:  10/06/19 223 lb 9.6 oz (101.4 kg)  08/19/19 215 lb 9.6 oz (97.8 kg)  06/17/19 208 lb (94.3 kg)   Constitutional:  Well-developed, in no acute distress. Psychiatric: Normal mood and affect. Behavior is normal. HEENT: Pupils normal.  Conjunctivae are normal. No scleral icterus. Cardiovascular: Normal rate, regular  rhythm. No edema Pulmonary/chest: Effort normal and breath sounds normal. No wheezing, rales or rhonchi. Abdominal: Soft, nondistended. Nontender. Bowel sounds active throughout. There are no masses palpable. No hepatomegaly.  Small umbilical hernia. Rectal: Deferred Neurological: Alert and oriented to person place and time. Skin: Skin is warm and dry. No rashes noted.  Data Reviewed: I have personally reviewed following labs and imaging studies  CBC: No flowsheet data found.  CMP: CMP Latest Ref Rng & Units 08/19/2019  Glucose 70 - 99 mg/dL 82  BUN 6 - 23 mg/dL 13  Creatinine 0.40 - 1.50 mg/dL 1.20  Sodium 135 - 145 mEq/L 139  Potassium 3.5 - 5.1 mEq/L 3.8  Chloride 96 - 112 mEq/L 105  CO2 19 - 32 mEq/L 28  Calcium 8.4 - 10.5 mg/dL 9.0      Carmell Austria, MD 08/11/2020, 10:12 AM  Cc: Antonietta Jewel, MD

## 2020-08-11 NOTE — Patient Instructions (Addendum)
If you are age 69 or older, your body mass index should be between 23-30. Your There is no height or weight on file to calculate BMI. If this is out of the aforementioned range listed, please consider follow up with your Primary Care Provider.  If you are age 40 or younger, your body mass index should be between 19-25. Your There is no height or weight on file to calculate BMI. If this is out of the aformentioned range listed, please consider follow up with your Primary Care Provider.   You have been scheduled for a Barium Esophogram at [facility] (1st floor of the hospital) on 08/26/2020 at 1030am . Please arrive 15 minutes prior to your appointment for registration. Make certain not to have anything to eat or drink 3 hours prior to your test. If you need to reschedule for any reason, please contact radiology at [phone] to do so. __________________________________________________________________ A barium swallow is an examination that concentrates on views of the esophagus. This tends to be a double contrast exam (barium and two liquids which, when combined, create a gas to distend the wall of the oesophagus) or single contrast (non-ionic iodine based). The study is usually tailored to your symptoms so a good history is essential. Attention is paid during the study to the form, structure and configuration of the esophagus, looking for functional disorders (such as aspiration, dysphagia, achalasia, motility and reflux) EXAMINATION You may be asked to change into a gown, depending on the type of swallow being performed. A radiologist and radiographer will perform the procedure. The radiologist will advise you of the type of contrast selected for your procedure and direct you during the exam. You will be asked to stand, sit or lie in several different positions and to hold a small amount of fluid in your mouth before being asked to swallow while the imaging is performed .In some instances you  may be asked to swallow barium coated marshmallows to assess the motility of a solid food bolus. The exam can be recorded as a digital or video fluoroscopy procedure. POST PROCEDURE It will take 1-2 days for the barium to pass through your system. To facilitate this, it is important, unless otherwise directed, to increase your fluids for the next 24-48hrs and to resume your normal diet.  This test typically takes about 30 minutes to perform. __________________________________________________________________________________  We have sent the following medications to your pharmacy for you to pick up at your convenience: Omeprazole   You have been scheduled for a colonoscopy. Please follow written instructions given to you at your visit today.  Please pick up your prep supplies at the pharmacy within the next 1-3 days. If you use inhalers (even only as needed), please bring them with you on the day of your procedure.  You have been scheduled for an endoscopy. Please follow written instructions given to you at your visit today. If you use inhalers (even only as needed), please bring them with you on the day of your procedure.  Thank you,  Dr. Jackquline Denmark

## 2020-08-12 ENCOUNTER — Telehealth: Payer: Self-pay | Admitting: Gastroenterology

## 2020-08-12 ENCOUNTER — Other Ambulatory Visit: Payer: Self-pay

## 2020-08-12 MED ORDER — OMEPRAZOLE 40 MG PO CPDR: 40.0000 mg | DELAYED_RELEASE_CAPSULE | Freq: Every day | ORAL | 3 refills | Status: DC

## 2020-08-12 NOTE — Telephone Encounter (Signed)
Inbound call from patient asking if Omeprazole prescription can be sent to [facility] on [address]. [facility] will not refill it

## 2020-08-12 NOTE — Telephone Encounter (Signed)
done

## 2020-08-26 ENCOUNTER — Ambulatory Visit (HOSPITAL_COMMUNITY): Admission: RE | Admit: 2020-08-26 | Payer: Medicare Other | Source: Ambulatory Visit

## 2020-09-16 ENCOUNTER — Other Ambulatory Visit: Payer: Self-pay

## 2020-09-16 ENCOUNTER — Encounter: Payer: Self-pay | Admitting: Gastroenterology

## 2020-09-16 ENCOUNTER — Ambulatory Visit (AMBULATORY_SURGERY_CENTER): Payer: Medicare Other | Admitting: Gastroenterology

## 2020-09-16 VITALS — BP 107/66 | HR 91 | Temp 97.9°F | Resp 16 | Ht 73.0 in | Wt 223.0 lb

## 2020-09-16 DIAGNOSIS — K219 Gastro-esophageal reflux disease without esophagitis: Secondary | ICD-10-CM

## 2020-09-16 DIAGNOSIS — K319 Disease of stomach and duodenum, unspecified: Secondary | ICD-10-CM | POA: Diagnosis not present

## 2020-09-16 DIAGNOSIS — K449 Diaphragmatic hernia without obstruction or gangrene: Secondary | ICD-10-CM | POA: Diagnosis not present

## 2020-09-16 DIAGNOSIS — D122 Benign neoplasm of ascending colon: Secondary | ICD-10-CM | POA: Diagnosis not present

## 2020-09-16 DIAGNOSIS — K3189 Other diseases of stomach and duodenum: Secondary | ICD-10-CM | POA: Diagnosis not present

## 2020-09-16 DIAGNOSIS — D123 Benign neoplasm of transverse colon: Secondary | ICD-10-CM

## 2020-09-16 DIAGNOSIS — K222 Esophageal obstruction: Secondary | ICD-10-CM | POA: Diagnosis not present

## 2020-09-16 DIAGNOSIS — K2980 Duodenitis without bleeding: Secondary | ICD-10-CM

## 2020-09-16 DIAGNOSIS — K2970 Gastritis, unspecified, without bleeding: Secondary | ICD-10-CM

## 2020-09-16 DIAGNOSIS — Z86010 Personal history of colonic polyps: Secondary | ICD-10-CM | POA: Diagnosis not present

## 2020-09-16 DIAGNOSIS — D125 Benign neoplasm of sigmoid colon: Secondary | ICD-10-CM

## 2020-09-16 DIAGNOSIS — Z1211 Encounter for screening for malignant neoplasm of colon: Secondary | ICD-10-CM

## 2020-09-16 DIAGNOSIS — R1310 Dysphagia, unspecified: Secondary | ICD-10-CM | POA: Diagnosis not present

## 2020-09-16 MED ORDER — SODIUM CHLORIDE 0.9 % IV SOLN: 500.0000 mL | Freq: Once | INTRAVENOUS | Status: DC

## 2020-09-16 NOTE — Op Note (Signed)
Ronald Nash Patient Name: Ronald Nash Procedure Date: 09/16/2020 2:53 PM MRN: [id number] Endoscopist: Jackquline Denmark , MD Age: 69 Referring MD:  Date of Birth: 11-27-1950 Gender: Male Account #: [id number] Procedure:                Colonoscopy Indications:              High risk colon cancer surveillance: Personal                            history of colonic polyps Medicines:                Monitored Anesthesia Care Procedure:                Pre-Anesthesia Assessment:                           - Prior to the procedure, a History and Physical                            was performed, and patient medications and                            allergies were reviewed. The patient's tolerance of                            previous anesthesia was also reviewed. The risks                            and benefits of the procedure and the sedation                            options and risks were discussed with the patient.                            All questions were answered, and informed consent                            was obtained. Prior Anticoagulants: The patient has                            taken no previous anticoagulant or antiplatelet                            agents. ASA Grade Assessment: II - A patient with                            mild systemic disease. After reviewing the risks                            and benefits, the patient was deemed in                            satisfactory condition to undergo the procedure.  After obtaining informed consent, the colonoscope                            was passed under direct vision. Throughout the                            procedure, the patient's blood pressure, pulse, and                            oxygen saturations were monitored continuously. The                            Olympus CF-HQ190L [id number]) Colonoscope was                            introduced through the anus and advanced to the  the                            cecum, identified by appendiceal orifice and                            ileocecal valve. Thereafter 2 cm of TI was                            intubated. The colonoscopy was performed without                            difficulty. The patient tolerated the procedure                            well. The quality of the bowel preparation was good                            except in some areas of the colon where there was                            solid stool which could not be fully washed.                            Aggressive suctioning and aspiration was performed.                            Overall over 90-95% of the colonic mucosa was                            visualized satisfactorily. The terminal ileum,                            ileocecal valve, appendiceal orifice, and rectum                            were photographed. Scope In: 3:10:58 PM Scope Out: 3:30:55 PM Scope Withdrawal Time: 0 hours 13 minutes 0 seconds  Total Procedure  Duration: 0 hours 19 minutes 57 seconds  Findings:                 Three sessile polyps were found in the mid sigmoid                            colon, proximal transverse colon and proximal                            ascending colon. The polyps were 6 to 8 mm in size.                            These polyps were removed with a cold snare.                            Resection and retrieval were complete.                           A few small-mouthed diverticula were found in the                            sigmoid colon.                           Non-bleeding internal hemorrhoids were found during                            retroflexion. The hemorrhoids were moderate.                           The exam was otherwise without abnormality on                            direct and retroflexion views. The colon was highly                            redundant. Terminal ileum was normal. Complications:            No immediate  complications. Estimated Blood Loss:     Estimated blood loss: none. Impression:               - Three 6 to 8 mm polyps in the mid sigmoid colon,                            in the proximal transverse colon and in the                            proximal ascending colon, removed with a cold                            snare. Resected and retrieved.                           - Mild sigmoid diverticulosis.                           -  Non-bleeding internal hemorrhoids.                           - The examination was otherwise normal on direct                            and retroflexion views. Recommendation:           - Patient has a contact number available for                            emergencies. The signs and symptoms of potential                            delayed complications were discussed with the                            patient. Return to normal activities tomorrow.                            Written discharge instructions were provided to the                            patient.                           - Resume previous diet.                           - Continue present medications.                           - Await pathology results.                           - Repeat colonoscopy for surveillance based on                            pathology results.                           - The findings and recommendations were discussed                            with the patient's family. Jackquline Denmark, MD 09/16/2020 3:46:12 PM This report has been signed electronically.

## 2020-09-16 NOTE — Progress Notes (Signed)
Called to room to assist during endoscopic procedure.  Patient ID and intended procedure confirmed with present staff. Received instructions for my participation in the procedure from the performing physician.  

## 2020-09-16 NOTE — Progress Notes (Signed)
Report to PACU, RN, vss, BBS= Clear.  

## 2020-09-16 NOTE — Op Note (Signed)
[place] Patient Name: Ronald Nash Procedure Date: 09/16/2020 2:53 PM MRN: [id number] Endoscopist: Jackquline Denmark , MD Age: 70 Referring MD:  Date of Birth: 05/17/1950 Gender: Male Account #: [id number] Procedure:                Upper GI endoscopy Indications:              Dysphagia Medicines:                Monitored Anesthesia Care Procedure:                Pre-Anesthesia Assessment:                           - Prior to the procedure, a History and Physical                            was performed, and patient medications and                            allergies were reviewed. The patient's tolerance of                            previous anesthesia was also reviewed. The risks                            and benefits of the procedure and the sedation                            options and risks were discussed with the patient.                            All questions were answered, and informed consent                            was obtained. Prior Anticoagulants: The patient has                            taken no previous anticoagulant or antiplatelet                            agents. ASA Grade Assessment: II - A patient with                            mild systemic disease. After reviewing the risks                            and benefits, the patient was deemed in                            satisfactory condition to undergo the procedure.                           After obtaining informed consent, the endoscope was  passed under direct vision. Throughout the                            procedure, the patient's blood pressure, pulse, and                            oxygen saturations were monitored continuously. The                            [id number] #[id number] was introduced through the                            mouth, and advanced to the second part of duodenum.                            The upper GI endoscopy was accomplished without                             difficulty. The patient tolerated the procedure                            well. Scope In: Scope Out: Findings:                 One benign-appearing, intrinsic moderate                            (circumferential scarring or stenosis; an endoscope                            may pass) stenosis was found at the GE junction, 40                            cm from the incisors. This stenosis measured 1.2 cm                            (inner diameter). The stenosis was traversed.                            Biopsies were taken with a cold forceps for                            histology. The scope was withdrawn. Dilation was                            performed with a Maloney dilator with mild                            resistance at 50 Fr and 52 Fr.                           A 2 cm hiatal hernia was present.                           Localized mild  inflammation characterized by                            erythema was found in the gastric antrum. The                            gastric folds were slightly more prominent. The                            pylorus was deformed suggestive of previous peptic                            ulcer disease. Multiple biopsies were taken with a                            cold forceps for histology.                           A few localized erosions without bleeding were                            found in the duodenal bulb. The bulb was deformed.                            Biopsies were taken with a cold forceps for                            histology. The second portion of the duodenum was                            normal. Complications:            No immediate complications. Estimated Blood Loss:     Estimated blood loss: none. Impression:               - Benign-appearing esophageal stenosis. Biopsied.                            Dilated.                           - 2 cm hiatal hernia.                           - Mild gastritis with a  deformed pylorus suggestive                            of previous peptic ulcer disease. No outlet                            obstruction.                           - Duodenal erosions without bleeding. Biopsied. Recommendation:           - Patient has a contact number available for  emergencies. The signs and symptoms of potential                            delayed complications were discussed with the                            patient. Return to normal activities tomorrow.                            Written discharge instructions were provided to the                            patient.                           - Post dilatation diet diet.                           - Continue present medications including omeprazole                            40 mg p.o. once a day.                           - Await pathology results.                           - No aspirin, ibuprofen, naproxen, or other                            non-steroidal anti-inflammatory drugs.                           - The findings and recommendations were discussed                            with the patient's family. Chew food especially                            meats and breads well and eat slowly. Jackquline Denmark, MD 09/16/2020 3:39:20 PM This report has been signed electronically.

## 2020-09-16 NOTE — Patient Instructions (Signed)
Await pathology  Follow dilation diet!- see handout!!  NO ASPIRIN, ASPIRIN CONTAINING PRODUCTS (BC OR GOODY POWDERS) OR NSAIDS (ADVIL, ALEVE, MOTRIN, IBUPROFEN)  TYLENOL IS OK!  Please read over handouts about hiatal hernias, gastritis, polyps, hemorrhoids and diverticulosis  YOU HAD AN ENDOSCOPIC PROCEDURE TODAY AT [facility]:   Refer to the procedure report that was given to you for any specific questions about what was found during the examination.  If the procedure report does not answer your questions, please call your gastroenterologist to clarify.  If you requested that your care partner not be given the details of your procedure findings, then the procedure report has been included in a sealed envelope for you to review at your convenience later.  YOU SHOULD EXPECT: Some feelings of bloating in the abdomen. Passage of more gas than usual.  Walking can help get rid of the air that was put into your GI tract during the procedure and reduce the bloating. If you had a lower endoscopy (such as a colonoscopy or flexible sigmoidoscopy) you may notice spotting of blood in your stool or on the toilet paper. If you underwent a bowel prep for your procedure, you may not have a normal bowel movement for a few days.  Please Note:  You might notice some irritation and congestion in your nose or some drainage.  This is from the oxygen used during your procedure.  There is no need for concern and it should clear up in a day or so.  SYMPTOMS TO REPORT IMMEDIATELY:   Following lower endoscopy (colonoscopy or flexible sigmoidoscopy):  Excessive amounts of blood in the stool  Significant tenderness or worsening of abdominal pains  Swelling of the abdomen that is new, acute  Fever of 100F or higher   Following upper endoscopy (EGD)  Vomiting of blood or coffee ground material  New chest pain or pain under the shoulder blades  Painful or persistently difficult swallowing  New  shortness of breath  Fever of 100F or higher  Black, tarry-looking stools  For urgent or emergent issues, a gastroenterologist can be reached at any hour by calling [phone]. Do not use MyChart messaging for urgent concerns.    DIET: FOLLOW DILATION DIET!!!  Drink plenty of fluids but you should avoid alcoholic beverages for 24 hours.  ACTIVITY:  You should plan to take it easy for the rest of today and you should NOT DRIVE or use heavy machinery until tomorrow (because of the sedation medicines used during the test).    FOLLOW UP: Our staff will call the number listed on your records 48-72 hours following your procedure to check on you and address any questions or concerns that you may have regarding the information given to you following your procedure. If we do not reach you, we will leave a message.  We will attempt to reach you two times.  During this call, we will ask if you have developed any symptoms of COVID 19. If you develop any symptoms (ie: fever, flu-like symptoms, shortness of breath, cough etc.) before then, please call [phone].  If you test positive for Covid 19 in the 2 weeks post procedure, please call and report this information to [place].    If any biopsies were taken you will be contacted by phone or by letter within the next 1-3 weeks.  Please call us at [phone] if you have not heard about the biopsies in 3 weeks.    SIGNATURES/CONFIDENTIALITY: You and/or your  care partner have signed paperwork which will be entered into your electronic medical record.  These signatures attest to the fact that that the information above on your After Visit Summary has been reviewed and is understood.  Full responsibility of the confidentiality of this discharge information lies with you and/or your care-partner.

## 2020-09-20 ENCOUNTER — Telehealth: Payer: Self-pay

## 2020-09-20 NOTE — Telephone Encounter (Signed)
LVM

## 2020-09-22 ENCOUNTER — Ambulatory Visit: Payer: Medicare Other | Attending: Internal Medicine

## 2020-09-22 DIAGNOSIS — Z20822 Contact with and (suspected) exposure to covid-19: Secondary | ICD-10-CM

## 2020-09-24 LAB — SARS-COV-2, NAA 2 DAY TAT

## 2020-09-24 LAB — NOVEL CORONAVIRUS, NAA: SARS-CoV-2, NAA: NOT DETECTED

## 2020-09-29 ENCOUNTER — Encounter: Payer: Self-pay | Admitting: Gastroenterology

## 2020-10-23 ENCOUNTER — Emergency Department (HOSPITAL_COMMUNITY)
Admission: EM | Admit: 2020-10-23 | Discharge: 2020-10-23 | Disposition: A | Discharge status: Home / Self Care | Payer: Medicare Other | Attending: Emergency Medicine | Admitting: Emergency Medicine

## 2020-10-23 ENCOUNTER — Other Ambulatory Visit: Payer: Self-pay

## 2020-10-23 DIAGNOSIS — U071 COVID-19: Secondary | ICD-10-CM | POA: Diagnosis not present

## 2020-10-23 DIAGNOSIS — Z20822 Contact with and (suspected) exposure to covid-19: Secondary | ICD-10-CM

## 2020-10-23 DIAGNOSIS — R059 Cough, unspecified: Secondary | ICD-10-CM | POA: Diagnosis present

## 2020-10-23 LAB — SARS CORONAVIRUS 2 (TAT 6-24 HRS): SARS Coronavirus 2: POSITIVE — AB

## 2020-10-23 NOTE — ED Notes (Signed)
E-signature pad unavailable at time of pt discharge. This RN discussed discharge materials with pt and answered all pt questions. Pt stated understanding of discharge material. ? ?

## 2020-10-23 NOTE — ED Triage Notes (Signed)
Pt tested positive today for covid, stated he wanted to make sure he was "treating it right." Brought +home test, symptomatic x1wk. Vaccinated, unsure of sick contact.

## 2020-10-23 NOTE — ED Provider Notes (Signed)
[facility] EMERGENCY DEPARTMENT Provider Note  CSN: [id number] Arrival date & time: 10/23/20 0057  Chief Complaint(s) Covid Positive  HPI Ronald Nash is a 70 y.o. male who presents to the emergency departments for evaluation of possible COVID.  He reports that he has had cough and congestion for the past week and had a positive home COVID test.  He reports that he saw his primary care provider who treated him for bronchitis with steroids and antibiotics.  He is here just to be checked out and confirm his COVID.  He denies any nausea or vomiting.  No headache.  No fevers.  No abdominal pain.  No diarrhea.  No other physical complaints.  HPI  Past Medical History Past Medical History:  Diagnosis Date   Acid reflux    Cataract    Diarrhea    Glaucoma    Internal hemorrhoid    Sleep apnea    Trouble swallowing    Patient Active Problem List   Diagnosis Date Noted   Obstructive sleep apnea 03/17/2019   GERD (gastroesophageal reflux disease) 03/17/2019   Home Medication(s) Prior to Admission medications   Medication Sig Start Date End Date Taking? Authorizing Provider  fluticasone (FLONASE) 50 MCG/ACT nasal spray 2 sprays by Each Nare route daily. Patient not taking: Reported on 09/16/2020 07/04/19   [provider]  HYDROcodone-acetaminophen (NORCO) 10-325 MG tablet Take 1 tablet by mouth 3 (three) times daily as needed. Patient not taking: Reported on 09/16/2020 02/19/19   [provider]  lidocaine (LIDODERM) 5 % APPLY 1 PATCH TO SKIN DAILY AS DIRECTED *LEAVE ON UP TO 12 HOURS, THEN REMOVE FOR 12 HOURS* 12/30/18   [provider]  omeprazole (PRILOSEC) 40 MG capsule Take 1 capsule (40 mg total) by mouth daily. Take 30 minutes before breakfast 08/12/20   Ronald Denmark, MD  tadalafil (CIALIS) 5 MG tablet SMARTSIG:1 Tablet(s) By Mouth As Needed Patient not taking: Reported on 09/16/2020 02/18/19   [provider]                                                                                                                                     Past Surgical History Past Surgical History:  Procedure Laterality Date   CATARACT EXTRACTION     COLONOSCOPY  2010   ESOPHAGOGASTRODUODENOSCOPY  2010   GLAUCOMA SURGERY  2018   SKIN GRAFT  08/11/2020   patient denies this    Family History Family History  Problem Relation Age of Onset   Hodgkin's lymphoma Mother     Social History Social History   Tobacco Use   Smoking status: Never   Smokeless tobacco: Never  Substance Use Topics   Alcohol use: Never   Drug use: Never   Allergies Patient has no known allergies.  Review of Systems Review of Systems All other systems are reviewed and are negative for acute change except as  noted in the HPI  Physical Exam Vital Signs  I have reviewed the triage vital signs BP 135/87   Pulse (!) 55   Temp 98.9 F (37.2 C) (Oral)   Resp 16   SpO2 98%   Physical Exam Vitals reviewed.  Constitutional:      General: He is not in acute distress.    Appearance: He is well-developed. He is not diaphoretic.  HENT:     Head: Normocephalic and atraumatic.     Nose: Nose normal.  Eyes:     General: No scleral icterus.       Right eye: No discharge.        Left eye: No discharge.     Conjunctiva/sclera: Conjunctivae normal.     Pupils: Pupils are equal, round, and reactive to light.  Cardiovascular:     Rate and Rhythm: Normal rate and regular rhythm.     Heart sounds: No murmur heard.   No friction rub. No gallop.  Pulmonary:     Effort: Pulmonary effort is normal. No respiratory distress.     Breath sounds: Normal breath sounds. No stridor. No rales.  Abdominal:     General: There is no distension.     Palpations: Abdomen is soft.     Tenderness: There is no abdominal tenderness.  Musculoskeletal:        General: No tenderness.     Cervical back: Normal range of motion and neck supple.  Skin:    General: Skin  is warm and dry.     Findings: No erythema or rash.  Neurological:     Mental Status: He is alert and oriented to person, place, and time.    ED Results and Treatments Labs (all labs ordered are listed, but only abnormal results are displayed) Labs Reviewed  SARS CORONAVIRUS 2 (TAT 6-24 HRS)                                                                                                                         EKG  EKG Interpretation  Date/Time:    Ventricular Rate:    PR Interval:    QRS Duration:   QT Interval:    QTC Calculation:   R Axis:     Text Interpretation:          Radiology No results found.  Pertinent labs & imaging results that were available during my care of the patient were reviewed by me and considered in my medical decision making (see chart for details).  Medications Ordered in ED Medications - No data to display  Procedures Procedures  (including critical care time)  Medical Decision Making / ED Course I have reviewed the nursing notes for this encounter and the patient's prior records (if available in EHR or on provided paperwork).   Ronald Nash was evaluated in Emergency Department on 10/23/2020 for the symptoms described in the history of present illness. He was evaluated in the context of the global COVID-19 pandemic, which necessitated consideration that the patient might be at risk for infection with the SARS-CoV-2 virus that causes COVID-19. Institutional protocols and algorithms that pertain to the evaluation of patients at risk for COVID-19 are in a state of rapid change based on information released by regulatory bodies including the CDC and federal and state organizations. These policies and algorithms were followed during the patient's care in the ED.  Well-appearing patient, not hypoxic.  No evidence of  dehydration or toxicity.  Lungs clear to auscultation bilaterally. COVID test obtained Patient will follow-up results on MyChart.      Final Clinical Impression(s) / ED Diagnoses Final diagnoses:  Cough  Suspected COVID-19 virus infection   The patient appears reasonably screened and/or stabilized for discharge and I doubt any other medical condition or other [facility] requiring further screening, evaluation, or treatment in the ED at this time prior to discharge. Safe for discharge with strict return precautions.  Disposition: Discharge  Condition: Good  I have discussed the results, Dx and Tx plan with the patient/family who expressed understanding and agree(s) with the plan. Discharge instructions discussed at length. The patient/family was given strict return precautions who verbalized understanding of the instructions. No further questions at time of discharge.    ED Discharge Orders     None        Follow Up: Antonietta Jewel, MD [address] [place] [address] [phone]  Call  as needed      This chart was dictated using voice recognition software.  Despite best efforts to proofread,  errors can occur which can change the documentation meaning.    Fatima Blank, MD 10/23/20 [phone]

## 2021-08-13 ENCOUNTER — Other Ambulatory Visit: Payer: Self-pay | Admitting: Gastroenterology

## 2022-07-15 ENCOUNTER — Other Ambulatory Visit: Payer: Self-pay | Admitting: Gastroenterology

## 2022-08-28 ENCOUNTER — Other Ambulatory Visit: Payer: Self-pay | Admitting: Gastroenterology

## 2022-09-26 ENCOUNTER — Other Ambulatory Visit: Payer: Self-pay | Admitting: Gastroenterology

## 2022-10-24 ENCOUNTER — Other Ambulatory Visit: Payer: Self-pay

## 2022-10-24 MED ORDER — OMEPRAZOLE 40 MG PO CPDR: 40.0000 mg | DELAYED_RELEASE_CAPSULE | Freq: Every day | ORAL | 2 refills | Status: AC

## 2023-11-22 ENCOUNTER — Other Ambulatory Visit: Payer: Self-pay | Admitting: Nurse Practitioner

## 2023-11-22 DIAGNOSIS — R600 Localized edema: Secondary | ICD-10-CM

## 2023-11-23 ENCOUNTER — Encounter: Payer: Self-pay | Admitting: Nurse Practitioner

## 2023-11-26 ENCOUNTER — Ambulatory Visit
Admission: RE | Admit: 2023-11-26 | Discharge: 2023-11-26 | Disposition: A | Discharge status: Home / Self Care | Source: Ambulatory Visit | Attending: Nurse Practitioner

## 2023-11-26 DIAGNOSIS — R600 Localized edema: Secondary | ICD-10-CM

## 2023-12-05 NOTE — Procedures (Signed)
Mask fit

## 2023-12-26 ENCOUNTER — Ambulatory Visit (INDEPENDENT_AMBULATORY_CARE_PROVIDER_SITE_OTHER)

## 2023-12-26 VITALS — BP 120/68 | HR 66 | Ht 73.0 in | Wt 232.6 lb

## 2023-12-26 DIAGNOSIS — G4733 Obstructive sleep apnea (adult) (pediatric): Secondary | ICD-10-CM

## 2023-12-26 DIAGNOSIS — E66811 Obesity, class 1: Secondary | ICD-10-CM

## 2023-12-26 DIAGNOSIS — Z6830 Body mass index (BMI) 30.0-30.9, adult: Secondary | ICD-10-CM

## 2023-12-26 DIAGNOSIS — J343 Hypertrophy of nasal turbinates: Secondary | ICD-10-CM | POA: Diagnosis not present

## 2023-12-26 MED ORDER — SALINE SPRAY 0.65 % NA SOLN: 1.0000 | NASAL | 0 refills | Status: AC | PRN

## 2023-12-26 MED ORDER — FLUTICASONE PROPIONATE 50 MCG/ACT NA SUSP: 1.0000 | Freq: Every day | NASAL | 2 refills | Status: AC

## 2023-12-26 NOTE — Progress Notes (Signed)
 New Patient Pulmonology Office Visit   Subjective:  Patient ID: Ronald Nash, male    DOB: [birth year]  MRN: [id number]  Referred by: Arloa Jarvis, NP  CC:  Chief Complaint  Patient presents with   Consult    Sleep Consult Currently on CPAP, using most nights. Pt is unsure of DME, buys supplies on his own.      HPI Ronald Nash is a 73 y.o. male with OSA with some central component, glaucoma,GERD. Accident in 1990s w broken nose and DNS. [place]  He has had CPAP since 2012. Has had issues with tolerating CPAP where pressures have been too much and straps too tight. About a month ago he has had more difficulty wit tolerating CPAP. That is when he increased his CPAP pressure from 12 to 16 without much help. FFM.   Blocked nostrils. R is clear and left is mostly blocked intermittently. Has turbinate hypertrophy per ENT. Suggested trial of nasal steroids and may be a considered for turbinate reduction. No allergies usually.   BMI 31.   Mouth breather: yes.  Preferred sleeping position: back due to neck pain.   Sleep related Symptoms:  morning HA/dry mouth- dry mouth when there is air leak/ no morning HA.  tired on awakening, excessive daytime sleepiness- sometimes.  Restless legs- no  Sleep routine:  -Bed: mn. Asleep immediately.  -Nocturnal awakenings: 1-2 times for urination. Falls back sleep quickly.  -Wake: 6 -Napping:rarely.  -sleep hygiene: watch TV prior to sleep.   Habits: -Caffeine: rarely -Alcohol: no -Nicotine:non smoker.  -Recreational drugs: no   PRIOR TESTS and IMAGING: PSG/HSAT:  HST 2/21: AHI 37, mixed apneas 3/h, central apneas 6/h, 17/h hypopnea.  Desaturation for 38 minutes.  O2 nadir 79.  Most sleep in supine position.      12/26/2023    8:00 AM 03/17/2019   10:00 AM  Results of the Epworth flowsheet  Sitting and reading 0 0  Watching TV 1 1  Sitting, inactive in a public place (e.g. a theatre or a meeting) 1  0  As a passenger in a car for an hour without a break 0 0  Lying down to rest in the afternoon when circumstances permit 3 3  Sitting and talking to someone 0 1  Sitting quietly after a lunch without alcohol 2 2  In a car, while stopped for a few minutes in traffic 0 0  Total score 7 7    Allergies: Patient has no known allergies.  Current Outpatient Medications:    fluticasone  (FLONASE ) 50 MCG/ACT nasal spray, Place 1 spray into both nostrils daily., Disp: 16 g, Rfl: 2   HYDROcodone-acetaminophen (NORCO) 10-325 MG tablet, Take 1 tablet by mouth 3 (three) times daily as needed., Disp: , Rfl:    omeprazole  (PRILOSEC) 40 MG capsule, Take 1 capsule (40 mg total) by mouth daily. Take 30 minutes before breakfast., Disp: 90 capsule, Rfl: 2   sodium chloride  (OCEAN) 0.65 % SOLN nasal spray, Place 1 spray into both nostrils as needed for congestion., Disp: 135 mL, Rfl: 0   tadalafil (CIALIS) 5 MG tablet, SMARTSIG:1 Tablet(s) By Mouth As Needed, Disp: , Rfl:  Past Medical History:  Diagnosis Date   Acid reflux    Cataract    Diarrhea    Glaucoma    Internal hemorrhoid    Sleep apnea    Trouble swallowing    Past Surgical History:  Procedure Laterality Date  CATARACT EXTRACTION     COLONOSCOPY  2010   ESOPHAGOGASTRODUODENOSCOPY  2010   GLAUCOMA SURGERY  2018   SKIN GRAFT  08/11/2020   patient denies this    Family History  Problem Relation Age of Onset   Hodgkin's lymphoma Mother    Social History   Socioeconomic History   Marital status: Widowed    Spouse name: Not on file   Number of children: Not on file   Years of education: Not on file   Highest education level: Not on file  Occupational History   Occupation: retired  Tobacco Use   Smoking status: Never   Smokeless tobacco: Never  Substance and Sexual Activity   Alcohol use: Never   Drug use: Never   Sexual activity: Not on file  Other Topics Concern   Not on file  Social History Narrative   Not on file    Social Drivers of Health   Financial Resource Strain: Not on file  Food Insecurity: Not on file  Transportation Needs: Not on file  Physical Activity: Not on file  Stress: Not on file  Social Connections: Not on file  Intimate Partner Violence: Not on file       Objective:  BP 120/68   Pulse 66   Ht 6' 1 (1.854 m)   Wt 232 lb 9.6 oz (105.5 kg)   SpO2 100% Comment: RA  BMI 30.69 kg/m  BMI Readings from Last 3 Encounters:  12/26/23 30.69 kg/m  09/16/20 29.42 kg/m  10/06/19 29.50 kg/m    Physical Exam: CONSTITUTIONAL: NAD, well-appearing NASAL/OROPHARYNX:  Normal mucosa. No septal deviation.  Hypertrophy of both inferior turbinates. Modified Mallampati score 3. Tonsillar grade no.  CV: RRR s1s2 nl, no murmurs  RESP: Clear to auscultation, normal respiratory effort   NEURO: CN II/XII grossly intact PSYCH: Alert & oriented x 3, Euthymic, appropriate affect SKIN: clear on face with no sig rash or acne MUSCULOSKELETAL:  Normal gait       PAP download compliance data:  Encore/Airview: resmed Pressure:12>16cm by patient.  Hours of usage: ~ 5 hrs Days used >4hr: 20/30 Leak: leak 4L/hr AHI:16.  Assessment & Plan:   Assessment & Plan OSA (obstructive sleep apnea) Central component on prior home sleep study.  Difficulty tolerating CPAP with increasing pressures while supine.  Unable to lie on the side well because of neck pain. -Split-night study.  This will help evaluate central components for future candidacy for inspire.  Nasal turbinate hypertrophy Flonase  + prn saline spray. Discussed w risk of elevated eye pressure (albeit low). Advised to reach out to ophthalmogist regarding starting flonase  and closer follow ups w them if needed.   Class 1 obesity due to excess calories with body mass index (BMI) of 30.0 to 30.9 in adult, unspecified whether serious comorbidity present Discussed about improvement of OSA w weight loss.   Orders Placed This Encounter   Procedures   Split night study    He was counselled about not driving while drowsy which is common side effect of sleep related disorders.   Return for 1 month After Sleep study.   Erica Richwine, MD

## 2023-12-26 NOTE — Patient Instructions (Addendum)
 OSA: repeat sleep study in lab w mask on.  Nasal blockage: flonase  as directed on insert and as needed saline spray.

## 2024-01-02 ENCOUNTER — Ambulatory Visit: Admitting: Adult Health

## 2024-02-05 ENCOUNTER — Encounter (HOSPITAL_BASED_OUTPATIENT_CLINIC_OR_DEPARTMENT_OTHER): Admitting: Pulmonary Disease

## 2024-05-16 ENCOUNTER — Encounter: Payer: Self-pay | Admitting: Gastroenterology

## 2024-06-11 ENCOUNTER — Ambulatory Visit: Admitting: Gastroenterology
